# Patient Record
Sex: Female | Born: 1950
Health system: Southern US, Community
[De-identification: ages and names within clinical notes are randomized; demographics above are authoritative.]

---

## 2000-08-26 ENCOUNTER — Other Ambulatory Visit: Admission: RE | Admit: 2000-08-26 | Discharge: 2000-08-26 | Payer: Self-pay | Admitting: Dermatology

## 2003-04-01 ENCOUNTER — Ambulatory Visit (HOSPITAL_COMMUNITY): Admission: RE | Admit: 2003-04-01 | Discharge: 2003-04-01 | Payer: Self-pay | Admitting: Internal Medicine

## 2009-01-12 ENCOUNTER — Encounter: Admission: RE | Admit: 2009-01-12 | Discharge: 2009-01-12 | Payer: Self-pay

## 2010-03-16 ENCOUNTER — Other Ambulatory Visit: Payer: Self-pay | Admitting: Interventional Radiology

## 2010-03-16 DIAGNOSIS — I8393 Asymptomatic varicose veins of bilateral lower extremities: Secondary | ICD-10-CM

## 2010-03-22 ENCOUNTER — Ambulatory Visit
Admission: RE | Admit: 2010-03-22 | Discharge: 2010-03-22 | Disposition: A | Discharge status: Home / Self Care | Payer: BC Managed Care – PPO | Source: Ambulatory Visit | Attending: Interventional Radiology | Admitting: Interventional Radiology

## 2010-03-22 DIAGNOSIS — I8393 Asymptomatic varicose veins of bilateral lower extremities: Secondary | ICD-10-CM

## 2010-03-23 ENCOUNTER — Other Ambulatory Visit: Payer: Self-pay

## 2010-04-25 ENCOUNTER — Other Ambulatory Visit: Payer: Self-pay | Admitting: Interventional Radiology

## 2010-04-25 DIAGNOSIS — Z09 Encounter for follow-up examination after completed treatment for conditions other than malignant neoplasm: Secondary | ICD-10-CM

## 2010-04-25 DIAGNOSIS — I83811 Varicose veins of right lower extremities with pain: Secondary | ICD-10-CM

## 2010-05-17 ENCOUNTER — Ambulatory Visit
Admission: RE | Admit: 2010-05-17 | Discharge: 2010-05-17 | Disposition: A | Discharge status: Home / Self Care | Payer: BC Managed Care – PPO | Source: Ambulatory Visit | Attending: Interventional Radiology | Admitting: Interventional Radiology

## 2010-05-17 VITALS — BP 104/70 | HR 74 | Temp 97.8°F | Resp 14 | Ht 62.0 in | Wt 125.0 lb

## 2010-05-17 DIAGNOSIS — I83811 Varicose veins of right lower extremities with pain: Secondary | ICD-10-CM

## 2010-05-17 MED ORDER — DIAZEPAM 5 MG PO TABS: 5.0000 mg | ORAL_TABLET | ORAL | Status: DC

## 2010-05-17 MED ORDER — SODIUM CHLORIDE 0.9 % IJ SOLN: 3.0000 mL | INTRAMUSCULAR | Status: DC | PRN

## 2010-05-17 NOTE — Progress Notes (Signed)
Informed consent obtained.  Given verbal & written D/C instructions.  Pt states that she understands.    8657  Valium 5 mg po    per Dr Fredia Sorrow            22 g x1" angio started IV Right antecubital (x 1 attempt) w/ NSL & 7" ext.  Flushed w/ 5 ml NSS w/o difficulty.  Site "u".    Per Dr Fredia Sorrow  1120    Pt wearing 20-30 mm Hg thigh high graduated compression garment on RLE post procedure.    1125    IV D'C'd.  Catheter intact. Site "u".       1135   Ambulated x 10 mins.  Gait steady.  Tolerated well.  1145   Reviewed D/C instructions w/ pt.  Husband available to drive.  Pt D/C'd to home.

## 2010-05-18 ENCOUNTER — Ambulatory Visit: Payer: BC Managed Care – PPO

## 2010-05-18 ENCOUNTER — Other Ambulatory Visit: Payer: BC Managed Care – PPO

## 2010-05-24 ENCOUNTER — Ambulatory Visit
Admission: RE | Admit: 2010-05-24 | Discharge: 2010-05-24 | Disposition: A | Discharge status: Home / Self Care | Payer: BC Managed Care – PPO | Source: Ambulatory Visit | Attending: Interventional Radiology | Admitting: Interventional Radiology

## 2010-05-24 ENCOUNTER — Other Ambulatory Visit: Payer: Self-pay | Admitting: Interventional Radiology

## 2010-05-24 DIAGNOSIS — I83811 Varicose veins of right lower extremities with pain: Secondary | ICD-10-CM

## 2010-05-24 DIAGNOSIS — Z09 Encounter for follow-up examination after completed treatment for conditions other than malignant neoplasm: Secondary | ICD-10-CM

## 2010-06-20 ENCOUNTER — Other Ambulatory Visit: Payer: BC Managed Care – PPO

## 2010-06-20 ENCOUNTER — Ambulatory Visit: Payer: BC Managed Care – PPO

## 2010-06-30 NOTE — Consult Note (Signed)
NAME:  Tracy Adams, Tracy Adams                         ACCOUNT NO.:  [id number]   MEDICAL RECORD NO.:  [id number]                  PATIENT TYPE:   LOCATION:                                       FACILITY:   PHYSICIAN:  Lionel December, M.D.                 DATE OF BIRTH:  11-13-1950   DATE OF CONSULTATION:  DATE OF DISCHARGE:                                   CONSULTATION   PRESENTING COMPLAINT:  Epigastric pain and nausea of over two months  duration.   HISTORY OF PRESENT ILLNESS:  Tracy Adams is a 60 year old Caucasian female who is  referred per courtesy of Dr. Eliberto Ivory for GI evaluation. She was in her usual  state of health until November of last year when she began to experience  nausea and epigastric pain. She felt that these symptoms were due to a lot  of her medication and she decided to stop her Bextra, but could not tell any  difference.  She was subsequently seen by Dr. Eliberto Ivory.  Her Actonel was also  discontinued. She was initially treated with Prilosec which did not help and  similarly she failed dicyclomine and for the last week she has been on  Protonix and so far has not seen a big improvement. She feels as if she is  going throw up, but she has not had emesis. She describes this as being in  the mid epigastric area which she describes as a dull pain. It is more  pronounced when she is fasting or when she eats she full. She feels like she  is going throw up.  She also feels bloated.  She had a normal CBC and LFTs  on March 05, 2003. Her H. pylori serology was also negative as was upper  abdominal ultrasound. The patient denies recent change in her bowel habits,  melena, or rectal bleeding. She feels her appetite is fair and she has not  lost any weight recently.   REVIEW OF SYSTEMS:  Negative for heartburn or dysphagia.   She is presently on Protonix 40 mg q.a.m., B12 1 mg daily, Ester-C 500 mg  daily, ________ daily, Caltrate and vitamin D, Flax oil 1 gm q.i.d., B6 50  mg  daily, and valproic acid daily.   PAST MEDICAL HISTORY:  She has osteoarthrosis involving her knees, joints,  and both hands. She also has either osteoporosis or osteopenia. She was on  Actonel for about a year.  She also has a history of acne for which she uses  amoxicillin on a p.r.n. basis. She has had bilateral bunionectomy about five  years ago.   ALLERGIES:  NK.   FAMILY HISTORY:  Both parents had heart disease. Mother died of MI in her  55s and father of Alzheimer disease also in his 67s.  She has seven sisters,  some of whom have high cholesterol, two have osteoarthrosis, one has RA, and  two sisters  have hypothyroidism. She lost one brother in an auto accident in  his 4s and she has three brothers living. One has hypercholesterolemia.   SOCIAL HISTORY:  She is married. She has three children in good health. She  worked at [employer] for 10 years, but now she works intermittently  and paints houses. She has never smoked cigarettes and does not drink  alcohol.   PHYSICAL EXAMINATION:  GENERAL: A pleasant, well-nourished, well-developed  Caucasian female who is in no acute distress.  VITAL SIGNS: She weighs 133 pounds. She is 5 feet 2 inches tall, pulse 74  per minute, blood pressure 120/80, temperature 96.6.  HEENT: Conjunctiva is pink. Sclera is nonicteric. Oropharyngeal mucosa is  normal.  NECK: Without masses or thyromegaly.  CARDIAC: Regular rhythm. Normal S1 and S2. No murmur or gallop noted.  LUNGS: Clear to auscultation.  ABDOMEN: Symmetric. Bowel sounds are normal. No bruits noted. Palpation  reveals soft abdomen with mid epigastric tenderness which is moderate with  some guarding. No organomegaly or masses.  RECTAL EXAM: Guaiac negative stool. She does not have clubbing or peripheral  edema.   Laboratory from March 05, 2003, reveal bilirubin 0.6, AP 47, AST 21,  calcium 10.2, albumin 4.7.  WBC 4.6, H&H 13.6 and 39.8, platelet count not  available. Her  TSH was 2.9. H. pylori serology is negative.   ASSESSMENT:  Tracy Adams is a 60 year old Caucasian female with an almost three-  month history of epigastric pain whose symptoms are not improved with PPI  antispasmodic. She was also taken off her Actonel and NSAID without any  symptomatic improvement. Her LFTs are normal, Helicobacter pylori serology  is negative, and ultrasound is also negative for cholelithiasis. She could  have chronic dyspepsia, but we need to make sure she does not have  peptic  ulcer disease.   RECOMMENDATIONS:  Would increase her Protonix to 40 mg b.i.d. Diagnostic  esophagogastroduodenoscopy to be performed at [facility] in the near future. If EGD  is entirely normal, will proceed with abdominal CT with attention to  pancreas.   I would like to thank Dr. Eliberto Ivory for the opportunity to participate in the  care of this nice lady.   ADDENDUM:  We also discussed the issue of screening colonoscopy, but I  suggested that this be deferred until her acute symptomatology has been  addressed and resolved.      ___________________________________________                                            Lionel December, M.D.   NR/MEDQ  D:  03/29/2003  T:  03/29/2003  Job:  [id number]   cc:   Eliberto Ivory, M.D.

## 2010-06-30 NOTE — Op Note (Signed)
NAMECHELLE, Tracy Adams                         ACCOUNT NO.:  [id number]   MEDICAL RECORD NO.:  [id number]                   PATIENT TYPE:  AMB   LOCATION:  DAY                                  FACILITY:  [facility]   PHYSICIAN:  Lionel December, M.D.                 DATE OF BIRTH:  1951/01/23   DATE OF PROCEDURE:  04/01/2003  DATE OF DISCHARGE:                                 OPERATIVE REPORT   PROCEDURE:  Esophagogastroduodenoscopy.   INDICATIONS FOR PROCEDURE:  [name] is a 60 year old Caucasian female with  over three month history of nausea and epigastric pain who has not responded  to acid suppression. She is also on NSAID and Fosamax which were  discontinued but without symptomatic improvement. Ultrasound had been  negative and H. pylori serology is also negative. She is undergoing  diagnostic EGD.  The procedure is reviewed with the patient and informed  consent was obtained.   PREOP MEDICATIONS:  Cetacaine spray for pharyngeal topical anesthesia,  Demerol 25 mg IV, Versed 8 mg IV in divided dose.   FINDINGS:  Procedure performed in endoscopy suite. The patient's vital signs  and O2 sat were monitored during the procedure and remained stable. The  patient was placed in the left lateral decubitus position and Olympus  videoscope was passed through oropharynx without any difficulty into the  esophagus.   ESOPHAGUS:  The mucosa of the esophagus was normal throughout.  The  squamocolumnar junction was at 38 cm.  There was a 3 cm sliding hiatal  hernia.   STOMACH:  It was empty and distended very well with insufflation. Folds of  the proximal stomach were normal. Examination of the mucosa, gastric body,  antrum, pyloric channel as well as angularis, fundus and cardia was normal.   DUODENUM:  Examination of the bulb and second part of the duodenum was  normal. The endoscope was withdrawn.   The patient tolerated the procedure well.   FINAL DIAGNOSIS:  Small sliding hiatal hernia  otherwise normal exam.   RECOMMENDATIONS:  1. Will drop her Protonix back to 40 mg q.a.m.  2. Dicyclomine 10-20 mg before breakfast and evening meal.  3. Will proceed with abdominal CT to be performed at __________.      ___________________________________________                                            Lionel December, M.D.   NR/MEDQ  D:  04/01/2003  T:  04/01/2003  Job:  [id number]   cc:   Weyman Pedro, M.D., [place], [place]

## 2010-07-05 ENCOUNTER — Ambulatory Visit
Admission: RE | Admit: 2010-07-05 | Discharge: 2010-07-05 | Disposition: A | Discharge status: Home / Self Care | Payer: BC Managed Care – PPO | Source: Ambulatory Visit | Attending: Interventional Radiology | Admitting: Interventional Radiology

## 2010-07-05 DIAGNOSIS — I83811 Varicose veins of right lower extremities with pain: Secondary | ICD-10-CM

## 2010-12-21 ENCOUNTER — Encounter: Payer: Self-pay | Admitting: Radiology

## 2011-01-09 ENCOUNTER — Other Ambulatory Visit: Payer: Self-pay | Admitting: Gastroenterology

## 2011-01-09 DIAGNOSIS — R1013 Epigastric pain: Secondary | ICD-10-CM

## 2011-01-09 DIAGNOSIS — R110 Nausea: Secondary | ICD-10-CM

## 2011-01-25 ENCOUNTER — Ambulatory Visit (HOSPITAL_COMMUNITY)
Admission: RE | Admit: 2011-01-25 | Discharge: 2011-01-25 | Disposition: A | Discharge status: Home / Self Care | Payer: BC Managed Care – PPO | Source: Ambulatory Visit | Attending: Gastroenterology | Admitting: Gastroenterology

## 2011-01-25 DIAGNOSIS — R1013 Epigastric pain: Secondary | ICD-10-CM | POA: Insufficient documentation

## 2011-01-25 DIAGNOSIS — R110 Nausea: Secondary | ICD-10-CM

## 2011-01-25 MED ORDER — SINCALIDE 5 MCG IJ SOLR
INTRAMUSCULAR | Status: AC
  Filled 2011-01-25: qty 5

## 2011-01-25 MED ORDER — TECHNETIUM TC 99M MEBROFENIN IV KIT
5.0000 | PACK | Freq: Once | INTRAVENOUS | Status: AC | PRN
  Administered 2011-01-25: 5 via INTRAVENOUS

## 2011-01-25 MED ORDER — SINCALIDE 5 MCG IJ SOLR
0.0200 ug/kg | Freq: Once | INTRAMUSCULAR | Status: AC
  Administered 2011-01-25: 1.1 ug via INTRAVENOUS

## 2011-02-01 ENCOUNTER — Other Ambulatory Visit: Payer: Self-pay | Admitting: Gastroenterology

## 2011-02-01 DIAGNOSIS — R1013 Epigastric pain: Secondary | ICD-10-CM

## 2011-02-01 DIAGNOSIS — R110 Nausea: Secondary | ICD-10-CM

## 2011-02-09 ENCOUNTER — Encounter (HOSPITAL_COMMUNITY)
Admission: RE | Admit: 2011-02-09 | Discharge: 2011-02-09 | Disposition: A | Discharge status: Home / Self Care | Payer: BC Managed Care – PPO | Source: Ambulatory Visit | Attending: Gastroenterology | Admitting: Gastroenterology

## 2011-02-09 DIAGNOSIS — R1013 Epigastric pain: Secondary | ICD-10-CM | POA: Insufficient documentation

## 2011-02-09 DIAGNOSIS — R110 Nausea: Secondary | ICD-10-CM | POA: Insufficient documentation

## 2011-02-09 MED ORDER — TECHNETIUM TC 99M SULFUR COLLOID
2.0000 | Freq: Once | INTRAVENOUS | Status: AC | PRN
  Administered 2011-02-09: 2 via ORAL

## 2011-03-22 ENCOUNTER — Ambulatory Visit
Admission: RE | Admit: 2011-03-22 | Discharge: 2011-03-22 | Disposition: A | Discharge status: Home / Self Care | Payer: BC Managed Care – PPO | Source: Ambulatory Visit | Attending: Plastic and Reconstructive Surgery | Admitting: Plastic and Reconstructive Surgery

## 2011-03-22 ENCOUNTER — Other Ambulatory Visit: Payer: Self-pay | Admitting: Unknown Physician Specialty

## 2011-03-22 ENCOUNTER — Other Ambulatory Visit: Payer: Self-pay | Admitting: Plastic and Reconstructive Surgery

## 2011-03-22 DIAGNOSIS — T8543XA Leakage of breast prosthesis and implant, initial encounter: Secondary | ICD-10-CM

## 2015-03-31 ENCOUNTER — Ambulatory Visit (INDEPENDENT_AMBULATORY_CARE_PROVIDER_SITE_OTHER): Payer: BLUE CROSS/BLUE SHIELD | Admitting: Otolaryngology

## 2015-03-31 DIAGNOSIS — J342 Deviated nasal septum: Secondary | ICD-10-CM

## 2015-03-31 DIAGNOSIS — J310 Chronic rhinitis: Secondary | ICD-10-CM

## 2015-04-28 ENCOUNTER — Ambulatory Visit (INDEPENDENT_AMBULATORY_CARE_PROVIDER_SITE_OTHER): Payer: BLUE CROSS/BLUE SHIELD | Admitting: Otolaryngology

## 2015-04-28 DIAGNOSIS — J310 Chronic rhinitis: Secondary | ICD-10-CM | POA: Diagnosis not present

## 2016-10-11 DIAGNOSIS — Z6823 Body mass index (BMI) 23.0-23.9, adult: Secondary | ICD-10-CM | POA: Diagnosis not present

## 2016-10-11 DIAGNOSIS — Z79899 Other long term (current) drug therapy: Secondary | ICD-10-CM | POA: Diagnosis not present

## 2016-10-11 DIAGNOSIS — E559 Vitamin D deficiency, unspecified: Secondary | ICD-10-CM | POA: Diagnosis not present

## 2016-10-11 DIAGNOSIS — Z7189 Other specified counseling: Secondary | ICD-10-CM | POA: Diagnosis not present

## 2016-10-11 DIAGNOSIS — F419 Anxiety disorder, unspecified: Secondary | ICD-10-CM | POA: Diagnosis not present

## 2016-10-11 DIAGNOSIS — E8940 Asymptomatic postprocedural ovarian failure: Secondary | ICD-10-CM | POA: Diagnosis not present

## 2016-10-11 DIAGNOSIS — Z299 Encounter for prophylactic measures, unspecified: Secondary | ICD-10-CM | POA: Diagnosis not present

## 2016-10-11 DIAGNOSIS — E7800 Pure hypercholesterolemia, unspecified: Secondary | ICD-10-CM | POA: Diagnosis not present

## 2016-10-11 DIAGNOSIS — Z1231 Encounter for screening mammogram for malignant neoplasm of breast: Secondary | ICD-10-CM | POA: Diagnosis not present

## 2016-10-11 DIAGNOSIS — Z0000 Encounter for general adult medical examination without abnormal findings: Secondary | ICD-10-CM | POA: Diagnosis not present

## 2016-10-11 DIAGNOSIS — Z1389 Encounter for screening for other disorder: Secondary | ICD-10-CM | POA: Diagnosis not present

## 2016-10-11 DIAGNOSIS — Z1211 Encounter for screening for malignant neoplasm of colon: Secondary | ICD-10-CM | POA: Diagnosis not present

## 2016-12-06 DIAGNOSIS — Z299 Encounter for prophylactic measures, unspecified: Secondary | ICD-10-CM | POA: Diagnosis not present

## 2016-12-06 DIAGNOSIS — M19041 Primary osteoarthritis, right hand: Secondary | ICD-10-CM | POA: Diagnosis not present

## 2016-12-06 DIAGNOSIS — Z2821 Immunization not carried out because of patient refusal: Secondary | ICD-10-CM | POA: Diagnosis not present

## 2016-12-06 DIAGNOSIS — E2839 Other primary ovarian failure: Secondary | ICD-10-CM | POA: Diagnosis not present

## 2016-12-06 DIAGNOSIS — M79641 Pain in right hand: Secondary | ICD-10-CM | POA: Diagnosis not present

## 2016-12-06 DIAGNOSIS — Z6823 Body mass index (BMI) 23.0-23.9, adult: Secondary | ICD-10-CM | POA: Diagnosis not present

## 2016-12-06 DIAGNOSIS — B001 Herpesviral vesicular dermatitis: Secondary | ICD-10-CM | POA: Diagnosis not present

## 2017-01-17 DIAGNOSIS — L821 Other seborrheic keratosis: Secondary | ICD-10-CM | POA: Diagnosis not present

## 2017-01-17 DIAGNOSIS — D485 Neoplasm of uncertain behavior of skin: Secondary | ICD-10-CM | POA: Diagnosis not present

## 2017-01-19 DIAGNOSIS — H10013 Acute follicular conjunctivitis, bilateral: Secondary | ICD-10-CM | POA: Diagnosis not present

## 2017-02-10 DIAGNOSIS — R509 Fever, unspecified: Secondary | ICD-10-CM | POA: Diagnosis not present

## 2017-02-10 DIAGNOSIS — J101 Influenza due to other identified influenza virus with other respiratory manifestations: Secondary | ICD-10-CM | POA: Diagnosis not present

## 2017-02-10 DIAGNOSIS — R062 Wheezing: Secondary | ICD-10-CM | POA: Diagnosis not present

## 2017-02-10 DIAGNOSIS — J40 Bronchitis, not specified as acute or chronic: Secondary | ICD-10-CM | POA: Diagnosis not present

## 2017-02-11 DIAGNOSIS — J111 Influenza due to unidentified influenza virus with other respiratory manifestations: Secondary | ICD-10-CM | POA: Diagnosis not present

## 2017-02-11 DIAGNOSIS — Z713 Dietary counseling and surveillance: Secondary | ICD-10-CM | POA: Diagnosis not present

## 2017-02-11 DIAGNOSIS — Z6822 Body mass index (BMI) 22.0-22.9, adult: Secondary | ICD-10-CM | POA: Diagnosis not present

## 2017-02-11 DIAGNOSIS — Z299 Encounter for prophylactic measures, unspecified: Secondary | ICD-10-CM | POA: Diagnosis not present

## 2017-02-21 DIAGNOSIS — H44009 Unspecified purulent endophthalmitis, unspecified eye: Secondary | ICD-10-CM | POA: Diagnosis not present

## 2017-02-21 DIAGNOSIS — Z789 Other specified health status: Secondary | ICD-10-CM | POA: Diagnosis not present

## 2017-02-21 DIAGNOSIS — Z299 Encounter for prophylactic measures, unspecified: Secondary | ICD-10-CM | POA: Diagnosis not present

## 2017-02-21 DIAGNOSIS — J069 Acute upper respiratory infection, unspecified: Secondary | ICD-10-CM | POA: Diagnosis not present

## 2017-02-21 DIAGNOSIS — Z6822 Body mass index (BMI) 22.0-22.9, adult: Secondary | ICD-10-CM | POA: Diagnosis not present

## 2017-04-18 ENCOUNTER — Encounter: Payer: Self-pay | Admitting: *Deleted

## 2017-04-19 ENCOUNTER — Ambulatory Visit (INDEPENDENT_AMBULATORY_CARE_PROVIDER_SITE_OTHER): Payer: Medicare Other | Admitting: Cardiovascular Disease

## 2017-04-19 ENCOUNTER — Encounter: Payer: Self-pay | Admitting: *Deleted

## 2017-04-19 ENCOUNTER — Encounter: Payer: Self-pay | Admitting: Cardiovascular Disease

## 2017-04-19 VITALS — BP 104/64 | HR 87 | Ht 62.0 in | Wt 133.0 lb

## 2017-04-19 DIAGNOSIS — R002 Palpitations: Secondary | ICD-10-CM | POA: Diagnosis not present

## 2017-04-19 DIAGNOSIS — R413 Other amnesia: Secondary | ICD-10-CM

## 2017-04-19 DIAGNOSIS — R079 Chest pain, unspecified: Secondary | ICD-10-CM

## 2017-04-19 DIAGNOSIS — R252 Cramp and spasm: Secondary | ICD-10-CM | POA: Diagnosis not present

## 2017-04-19 NOTE — Patient Instructions (Addendum)
Medication Instructions:  Continue all current medications.  Labwork: none  Testing/Procedures:  Your physician has requested that you have an echocardiogram. Echocardiography is a painless test that uses sound waves to create images of your heart. It provides your doctor with information about the size and shape of your heart and how well your heart's chambers and valves are working. This procedure takes approximately one hour. There are no restrictions for this procedure.  Your physician has requested that you have a stress echocardiogram. For further information please visit [email or web address]. Please follow instruction sheet as given.  Your physician has recommended that you wear a 21 day event monitor. Event monitors are medical devices that record the heart's electrical activity. Doctors most often [place] these monitors to diagnose arrhythmias. Arrhythmias are problems with the speed or rhythm of the heartbeat. The monitor is a small, portable device. You can wear one while you do your normal daily activities. This is usually used to diagnose what is causing palpitations/syncope (passing out).  Follow-Up: 6-8 weeks   Any Other Special Instructions Will Be Listed Below (If Applicable).  If you need a refill on your cardiac medications before your next appointment, please call your pharmacy.

## 2017-04-19 NOTE — Progress Notes (Signed)
CARDIOLOGY CONSULT NOTE  Patient ID: Tracy Adams MRN: [id number] DOB/AGE: [birth year] 67 y.o.  Admit date: (Not on file) Primary Physician: Glenda Chroman, MD Referring Physician: Glenda Chroman, MD  Reason for Consultation: Chest pain  HPI: Tracy Adams is a 67 y.o. female who is being seen today for the evaluation of chest pain at the request of Vyas, Dhruv B, MD.   I reviewed an ECG performed on 10/11/16 which demonstrated sinus rhythm with no ischemic ST segment or T wave ab normalities, nor any arrhythmias.  ECG performed in the office today which I ordered and personally interpreted demonstrates normal sinus rhythm with no ischemic ST segment or T-wave abnormalities, nor any arrhythmias.  She has been experiencing rapid palpitations followed by chest pain for the past 2 years.  Symptoms will last a few minutes and can occur both with rest and with exertion.  It is accompanied by shortness of breath, lightheadedness, and dizziness.  She denies syncope.  She also denies leg swelling, orthopnea, and paroxysmal nocturnal dyspnea.  Episodes occur once every 1 or 2 weeks.  She has also been experiencing bilateral feet cramping recently.  This is a new symptom for her.  She also describes intermittent right groin pain with difficulty lifting her right leg.  She also describes memory loss.  She said she was unable to recollect the year when filling out forms in our office today.  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  She does not smoke.  Family history: Father had Alzheimer's disease and died from this.  Mother died of an MI in her 45s.  Brother had CABG in his 19s.  A sister has coronary disease and had a stent in her 44s.    Allergies  Allergen Reactions  . Estrogens     NAUSEA  . Other     HYDROXYZINE sob DIFFICULTY BREATHING     Current Outpatient Medications  Medication Sig Dispense Refill  . ipratropium (ATROVENT) 0.06 %  nasal spray Place 2 sprays into both nostrils 2 (two) times daily.     No current facility-administered medications for this visit.     History reviewed. No pertinent past medical history.  History reviewed. No pertinent surgical history.  Social History   Socioeconomic History  . Marital status: Married    Spouse name: Not on file  . Number of children: Not on file  . Years of education: Not on file  . Highest education level: Not on file  Social Needs  . Financial resource strain: Not on file  . Food insecurity - worry: Not on file  . Food insecurity - inability: Not on file  . Transportation needs - medical: Not on file  . Transportation needs - non-medical: Not on file  Occupational History  . Not on file  Tobacco Use  . Smoking status: Never Smoker  . Smokeless tobacco: Never Used  Substance and Sexual Activity  . Alcohol use: No  . Drug use: No  . Sexual activity: Not on file  Other Topics Concern  . Not on file  Social History Narrative  . Not on file     No family history of premature CAD in 1st degree relatives.  Current Meds  Medication Sig  . ipratropium (ATROVENT) 0.06 % nasal spray Place 2 sprays into both nostrils 2 (two) times daily.      Review of systems complete and found  to be negative unless listed above in HPI    Physical exam Blood pressure 104/64, pulse 87, height 5\' 2"  (1.575 m), weight 133 lb (60.3 kg), SpO2 98 %. General: NAD Neck: No JVD, no thyromegaly or thyroid nodule.  Lungs: Clear to auscultation bilaterally with normal respiratory effort. CV: Nondisplaced PMI. Regular rate and rhythm, normal S1/S2, no S3/S4, no murmur.  No peripheral edema.  No carotid bruit.    Abdomen: Soft, nontender, no distention.  Skin: Intact without lesions or rashes.  Neurologic: Alert and oriented x 3.  Psych: Normal affect. Extremities: No clubbing or cyanosis.  HEENT: Normal.   ECG: Most recent ECG reviewed.   Labs: No results found for:  K, BUN, CREATININE, ALT, TSH, HGB   Lipids: No results found for: LDLCALC, LDLDIRECT, CHOL, TRIG, HDL      ASSESSMENT AND PLAN:  1.  Rapid palpitations with chest pain and shortness of breath: She may be experiencing paroxysmal tachycardia which could be representing a supraventricular arrhythmia such as atrial fibrillation or SVT.  I will obtain a 3-week event monitor for further characterization.  I will also obtain an echocardiogram to evaluate cardiac structure and function.  I will obtain a stress echocardiogram to assess for exercise-induced arrhythmias as well as to evaluate for inducible ischemic wall motion abnormalities.  2.  Bilateral feet cramps: Pedal pulses appear to be normal.  I educated her on exercise and stretching of the plantar fascia to help alleviate symptoms.  3.  Memory loss: She told me she did not tell her PCP about this.  At the very least she would need a Mini-Mental status examination questionnaire to then determine further investigations.  I will obtain a copy of blood work from her PCP.     Disposition: Follow up in 6-8 weeks.   Signed: Kate Sable, M.D., F.A.C.C.  04/19/2017, 1:50 PM

## 2017-04-23 ENCOUNTER — Encounter: Payer: Self-pay | Admitting: *Deleted

## 2017-04-23 ENCOUNTER — Telehealth: Payer: Self-pay | Admitting: Cardiovascular Disease

## 2017-04-23 NOTE — Telephone Encounter (Signed)
Pre-cert Verification for the following procedure   Stress Echo scheduled for 05-02-17   [facility]stine Na Echo scheduled for 05-02-17  [facility]

## 2017-04-29 ENCOUNTER — Ambulatory Visit: Payer: Medicare Other | Admitting: Orthopedic Surgery

## 2017-04-29 ENCOUNTER — Ambulatory Visit (INDEPENDENT_AMBULATORY_CARE_PROVIDER_SITE_OTHER): Payer: Medicare Other

## 2017-04-29 DIAGNOSIS — R002 Palpitations: Secondary | ICD-10-CM

## 2017-04-29 DIAGNOSIS — M654 Radial styloid tenosynovitis [de Quervain]: Secondary | ICD-10-CM | POA: Diagnosis not present

## 2017-04-29 DIAGNOSIS — M25531 Pain in right wrist: Secondary | ICD-10-CM | POA: Diagnosis not present

## 2017-05-01 ENCOUNTER — Ambulatory Visit: Payer: Medicare Other | Admitting: Orthopedic Surgery

## 2017-05-02 ENCOUNTER — Ambulatory Visit (HOSPITAL_COMMUNITY)
Admission: RE | Admit: 2017-05-02 | Discharge: 2017-05-02 | Disposition: A | Discharge status: Home / Self Care | Payer: Medicare Other | Source: Ambulatory Visit | Attending: Cardiovascular Disease | Admitting: Cardiovascular Disease

## 2017-05-02 ENCOUNTER — Other Ambulatory Visit: Payer: Self-pay

## 2017-05-02 ENCOUNTER — Ambulatory Visit (INDEPENDENT_AMBULATORY_CARE_PROVIDER_SITE_OTHER): Payer: Medicare Other

## 2017-05-02 DIAGNOSIS — R079 Chest pain, unspecified: Secondary | ICD-10-CM

## 2017-05-02 NOTE — Progress Notes (Signed)
*  PRELIMINARY RESULTS* Echocardiogram 2D Echocardiogram has been performed.  Tracy Adams 05/02/2017, 12:57 PM

## 2017-05-03 LAB — ECHOCARDIOGRAM STRESS TEST
CHL RATE OF PERCEIVED EXERTION: 13
CSEPEDS: 12 s
CSEPEW: 10.1 METS
CSEPHR: 101 %
CSEPPHR: 157 {beats}/min
Exercise duration (min): 8 min
MPHR: 155 {beats}/min
Rest HR: 81 {beats}/min

## 2017-05-09 ENCOUNTER — Telehealth: Payer: Self-pay | Admitting: *Deleted

## 2017-05-09 ENCOUNTER — Telehealth: Payer: Self-pay | Admitting: Cardiovascular Disease

## 2017-05-09 NOTE — Telephone Encounter (Signed)
STRESS ECHO -   Notes recorded by Herminio Commons, MD on 05/06/2017 at 3:58 PM EDT Normal.

## 2017-05-09 NOTE — Telephone Encounter (Signed)
Notes recorded by Laurine Blazer, LPN on 4/46/9507 at 2:25 PM EDT Patient notified. Copy to pmd.  ------  Notes recorded by Laurine Blazer, LPN on 7/50/5183 at 3:58 PM EDT Left message to return call.  ------  Notes recorded by Herminio Commons, MD on 05/06/2017 at 9:23 AM EDT Normal pumping function. Mild valve leakage.

## 2017-05-09 NOTE — Telephone Encounter (Signed)
Returning someones call 

## 2017-05-09 NOTE — Telephone Encounter (Signed)
Already notified patient of test results.

## 2017-05-09 NOTE — Telephone Encounter (Signed)
Patient notified.  Copy to pmd.   

## 2017-05-15 DIAGNOSIS — Z713 Dietary counseling and surveillance: Secondary | ICD-10-CM | POA: Diagnosis not present

## 2017-05-15 DIAGNOSIS — Z789 Other specified health status: Secondary | ICD-10-CM | POA: Diagnosis not present

## 2017-05-15 DIAGNOSIS — Z6822 Body mass index (BMI) 22.0-22.9, adult: Secondary | ICD-10-CM | POA: Diagnosis not present

## 2017-05-15 DIAGNOSIS — J029 Acute pharyngitis, unspecified: Secondary | ICD-10-CM | POA: Diagnosis not present

## 2017-05-15 DIAGNOSIS — Z299 Encounter for prophylactic measures, unspecified: Secondary | ICD-10-CM | POA: Diagnosis not present

## 2017-06-07 ENCOUNTER — Telehealth: Payer: Self-pay | Admitting: *Deleted

## 2017-06-07 NOTE — Telephone Encounter (Signed)
-----   Message from Herminio Commons, MD sent at 06/05/2017  8:55 AM EDT ----- Average heart rate 87 bpm.  Sinus rhythm and sinus tachycardia seen with PACs and PVCs.  There was one 5 beat and one 7 beat atrial run.  There were no sustained abnormal heart rhythms.  Symptoms correlated with both regular rhythms, fast heart rates, and extra beats. Start metoprolol 12.5 mg bid.

## 2017-06-07 NOTE — Telephone Encounter (Signed)
Pt aware and says she would rather not take metoprolol at this time. Asked that we print out results - pt husband (DPR) will come by office to pick up - routed to pcp

## 2017-06-15 DIAGNOSIS — H10013 Acute follicular conjunctivitis, bilateral: Secondary | ICD-10-CM | POA: Diagnosis not present

## 2017-06-25 ENCOUNTER — Ambulatory Visit: Payer: Medicare Other | Admitting: Cardiovascular Disease

## 2017-08-16 DIAGNOSIS — M654 Radial styloid tenosynovitis [de Quervain]: Secondary | ICD-10-CM | POA: Diagnosis not present

## 2017-08-22 DIAGNOSIS — M25531 Pain in right wrist: Secondary | ICD-10-CM | POA: Diagnosis not present

## 2017-08-22 DIAGNOSIS — S66811A Strain of other specified muscles, fascia and tendons at wrist and hand level, right hand, initial encounter: Secondary | ICD-10-CM | POA: Diagnosis not present

## 2017-08-22 DIAGNOSIS — M654 Radial styloid tenosynovitis [de Quervain]: Secondary | ICD-10-CM | POA: Diagnosis not present

## 2017-08-22 DIAGNOSIS — M19031 Primary osteoarthritis, right wrist: Secondary | ICD-10-CM | POA: Diagnosis not present

## 2017-08-28 DIAGNOSIS — M654 Radial styloid tenosynovitis [de Quervain]: Secondary | ICD-10-CM | POA: Diagnosis not present

## 2017-09-16 DIAGNOSIS — Z299 Encounter for prophylactic measures, unspecified: Secondary | ICD-10-CM | POA: Diagnosis not present

## 2017-09-16 DIAGNOSIS — M810 Age-related osteoporosis without current pathological fracture: Secondary | ICD-10-CM | POA: Diagnosis not present

## 2017-09-16 DIAGNOSIS — F419 Anxiety disorder, unspecified: Secondary | ICD-10-CM | POA: Diagnosis not present

## 2017-09-16 DIAGNOSIS — Z6822 Body mass index (BMI) 22.0-22.9, adult: Secondary | ICD-10-CM | POA: Diagnosis not present

## 2017-09-30 DIAGNOSIS — E7800 Pure hypercholesterolemia, unspecified: Secondary | ICD-10-CM | POA: Diagnosis not present

## 2017-09-30 DIAGNOSIS — Z299 Encounter for prophylactic measures, unspecified: Secondary | ICD-10-CM | POA: Diagnosis not present

## 2017-09-30 DIAGNOSIS — R202 Paresthesia of skin: Secondary | ICD-10-CM | POA: Diagnosis not present

## 2017-09-30 DIAGNOSIS — R5383 Other fatigue: Secondary | ICD-10-CM | POA: Diagnosis not present

## 2017-09-30 DIAGNOSIS — E559 Vitamin D deficiency, unspecified: Secondary | ICD-10-CM | POA: Diagnosis not present

## 2017-09-30 DIAGNOSIS — F419 Anxiety disorder, unspecified: Secondary | ICD-10-CM | POA: Diagnosis not present

## 2017-09-30 DIAGNOSIS — Z6823 Body mass index (BMI) 23.0-23.9, adult: Secondary | ICD-10-CM | POA: Diagnosis not present

## 2017-10-21 DIAGNOSIS — F419 Anxiety disorder, unspecified: Secondary | ICD-10-CM | POA: Diagnosis not present

## 2017-10-21 DIAGNOSIS — Z79899 Other long term (current) drug therapy: Secondary | ICD-10-CM | POA: Diagnosis not present

## 2017-10-21 DIAGNOSIS — Z6822 Body mass index (BMI) 22.0-22.9, adult: Secondary | ICD-10-CM | POA: Diagnosis not present

## 2017-10-21 DIAGNOSIS — Z299 Encounter for prophylactic measures, unspecified: Secondary | ICD-10-CM | POA: Diagnosis not present

## 2017-10-21 DIAGNOSIS — R5383 Other fatigue: Secondary | ICD-10-CM | POA: Diagnosis not present

## 2017-10-21 DIAGNOSIS — E7800 Pure hypercholesterolemia, unspecified: Secondary | ICD-10-CM | POA: Diagnosis not present

## 2017-10-21 DIAGNOSIS — Z0000 Encounter for general adult medical examination without abnormal findings: Secondary | ICD-10-CM | POA: Diagnosis not present

## 2017-10-21 DIAGNOSIS — Z7189 Other specified counseling: Secondary | ICD-10-CM | POA: Diagnosis not present

## 2017-10-21 DIAGNOSIS — Z1331 Encounter for screening for depression: Secondary | ICD-10-CM | POA: Diagnosis not present

## 2017-10-21 DIAGNOSIS — Z1339 Encounter for screening examination for other mental health and behavioral disorders: Secondary | ICD-10-CM | POA: Diagnosis not present

## 2018-01-31 DIAGNOSIS — M533 Sacrococcygeal disorders, not elsewhere classified: Secondary | ICD-10-CM | POA: Diagnosis not present

## 2018-01-31 DIAGNOSIS — Z6822 Body mass index (BMI) 22.0-22.9, adult: Secondary | ICD-10-CM | POA: Diagnosis not present

## 2018-01-31 DIAGNOSIS — S3992XA Unspecified injury of lower back, initial encounter: Secondary | ICD-10-CM | POA: Diagnosis not present

## 2018-01-31 DIAGNOSIS — Z2821 Immunization not carried out because of patient refusal: Secondary | ICD-10-CM | POA: Diagnosis not present

## 2018-01-31 DIAGNOSIS — Z299 Encounter for prophylactic measures, unspecified: Secondary | ICD-10-CM | POA: Diagnosis not present

## 2018-03-16 DIAGNOSIS — S63636A Sprain of interphalangeal joint of right little finger, initial encounter: Secondary | ICD-10-CM | POA: Diagnosis not present

## 2018-03-16 DIAGNOSIS — S322XXA Fracture of coccyx, initial encounter for closed fracture: Secondary | ICD-10-CM | POA: Diagnosis not present

## 2018-04-14 DIAGNOSIS — L989 Disorder of the skin and subcutaneous tissue, unspecified: Secondary | ICD-10-CM | POA: Diagnosis not present

## 2018-04-14 DIAGNOSIS — Z299 Encounter for prophylactic measures, unspecified: Secondary | ICD-10-CM | POA: Diagnosis not present

## 2018-04-14 DIAGNOSIS — M19049 Primary osteoarthritis, unspecified hand: Secondary | ICD-10-CM | POA: Diagnosis not present

## 2018-04-29 DIAGNOSIS — L570 Actinic keratosis: Secondary | ICD-10-CM | POA: Diagnosis not present

## 2018-04-29 DIAGNOSIS — L728 Other follicular cysts of the skin and subcutaneous tissue: Secondary | ICD-10-CM | POA: Diagnosis not present

## 2018-04-29 DIAGNOSIS — L280 Lichen simplex chronicus: Secondary | ICD-10-CM | POA: Diagnosis not present

## 2018-05-01 DIAGNOSIS — R208 Other disturbances of skin sensation: Secondary | ICD-10-CM | POA: Diagnosis not present

## 2018-05-01 DIAGNOSIS — L728 Other follicular cysts of the skin and subcutaneous tissue: Secondary | ICD-10-CM | POA: Diagnosis not present

## 2018-05-01 DIAGNOSIS — B0089 Other herpesviral infection: Secondary | ICD-10-CM | POA: Diagnosis not present

## 2018-05-01 DIAGNOSIS — L700 Acne vulgaris: Secondary | ICD-10-CM | POA: Diagnosis not present

## 2018-05-01 DIAGNOSIS — L578 Other skin changes due to chronic exposure to nonionizing radiation: Secondary | ICD-10-CM | POA: Diagnosis not present

## 2018-06-26 DIAGNOSIS — Z03818 Encounter for observation for suspected exposure to other biological agents ruled out: Secondary | ICD-10-CM | POA: Diagnosis not present

## 2018-10-09 ENCOUNTER — Other Ambulatory Visit: Payer: Self-pay

## 2018-10-09 DIAGNOSIS — R6889 Other general symptoms and signs: Secondary | ICD-10-CM | POA: Diagnosis not present

## 2018-10-09 DIAGNOSIS — Z20822 Contact with and (suspected) exposure to covid-19: Secondary | ICD-10-CM

## 2018-10-11 LAB — NOVEL CORONAVIRUS, NAA: SARS-CoV-2, NAA: NOT DETECTED

## 2019-03-19 DIAGNOSIS — E559 Vitamin D deficiency, unspecified: Secondary | ICD-10-CM | POA: Diagnosis not present

## 2019-03-19 DIAGNOSIS — Z299 Encounter for prophylactic measures, unspecified: Secondary | ICD-10-CM | POA: Diagnosis not present

## 2019-03-19 DIAGNOSIS — Z1339 Encounter for screening examination for other mental health and behavioral disorders: Secondary | ICD-10-CM | POA: Diagnosis not present

## 2019-03-19 DIAGNOSIS — Z7189 Other specified counseling: Secondary | ICD-10-CM | POA: Diagnosis not present

## 2019-03-19 DIAGNOSIS — F419 Anxiety disorder, unspecified: Secondary | ICD-10-CM | POA: Diagnosis not present

## 2019-03-19 DIAGNOSIS — E7800 Pure hypercholesterolemia, unspecified: Secondary | ICD-10-CM | POA: Diagnosis not present

## 2019-03-19 DIAGNOSIS — Z0000 Encounter for general adult medical examination without abnormal findings: Secondary | ICD-10-CM | POA: Diagnosis not present

## 2019-03-19 DIAGNOSIS — Z1331 Encounter for screening for depression: Secondary | ICD-10-CM | POA: Diagnosis not present

## 2019-03-19 DIAGNOSIS — R5383 Other fatigue: Secondary | ICD-10-CM | POA: Diagnosis not present

## 2019-03-19 DIAGNOSIS — Z79899 Other long term (current) drug therapy: Secondary | ICD-10-CM | POA: Diagnosis not present

## 2019-04-10 DIAGNOSIS — E2839 Other primary ovarian failure: Secondary | ICD-10-CM | POA: Diagnosis not present

## 2019-04-20 DIAGNOSIS — L570 Actinic keratosis: Secondary | ICD-10-CM | POA: Diagnosis not present

## 2019-05-12 DIAGNOSIS — Z789 Other specified health status: Secondary | ICD-10-CM | POA: Diagnosis not present

## 2019-05-12 DIAGNOSIS — J069 Acute upper respiratory infection, unspecified: Secondary | ICD-10-CM | POA: Diagnosis not present

## 2019-05-12 DIAGNOSIS — Z299 Encounter for prophylactic measures, unspecified: Secondary | ICD-10-CM | POA: Diagnosis not present

## 2019-05-25 DIAGNOSIS — L700 Acne vulgaris: Secondary | ICD-10-CM | POA: Diagnosis not present

## 2019-05-25 DIAGNOSIS — L820 Inflamed seborrheic keratosis: Secondary | ICD-10-CM | POA: Diagnosis not present

## 2019-05-25 DIAGNOSIS — R208 Other disturbances of skin sensation: Secondary | ICD-10-CM | POA: Diagnosis not present

## 2019-05-28 ENCOUNTER — Ambulatory Visit: Payer: Medicare Other | Attending: Internal Medicine

## 2019-05-28 ENCOUNTER — Other Ambulatory Visit: Payer: Self-pay

## 2019-05-28 DIAGNOSIS — Z20822 Contact with and (suspected) exposure to covid-19: Secondary | ICD-10-CM

## 2019-05-29 ENCOUNTER — Telehealth: Payer: Self-pay | Admitting: *Deleted

## 2019-05-29 LAB — NOVEL CORONAVIRUS, NAA: SARS-CoV-2, NAA: NOT DETECTED

## 2019-05-29 LAB — SARS-COV-2, NAA 2 DAY TAT

## 2019-05-29 NOTE — Telephone Encounter (Signed)
Pt called in requesting COVID-19 tet result.   I let her know it was not detected meaning she did not have the virus. She thanked me for my help.

## 2019-06-04 DIAGNOSIS — Z20828 Contact with and (suspected) exposure to other viral communicable diseases: Secondary | ICD-10-CM | POA: Diagnosis not present

## 2019-08-27 DIAGNOSIS — M549 Dorsalgia, unspecified: Secondary | ICD-10-CM | POA: Diagnosis not present

## 2019-08-27 DIAGNOSIS — M47816 Spondylosis without myelopathy or radiculopathy, lumbar region: Secondary | ICD-10-CM | POA: Diagnosis not present

## 2019-08-27 DIAGNOSIS — Z299 Encounter for prophylactic measures, unspecified: Secondary | ICD-10-CM | POA: Diagnosis not present

## 2019-08-27 DIAGNOSIS — I7090 Unspecified atherosclerosis: Secondary | ICD-10-CM | POA: Diagnosis not present

## 2019-08-27 DIAGNOSIS — E7800 Pure hypercholesterolemia, unspecified: Secondary | ICD-10-CM | POA: Diagnosis not present

## 2019-09-15 DIAGNOSIS — L570 Actinic keratosis: Secondary | ICD-10-CM | POA: Diagnosis not present

## 2020-03-14 DIAGNOSIS — L570 Actinic keratosis: Secondary | ICD-10-CM | POA: Diagnosis not present

## 2020-05-05 DIAGNOSIS — H10013 Acute follicular conjunctivitis, bilateral: Secondary | ICD-10-CM | POA: Diagnosis not present

## 2020-08-05 DIAGNOSIS — Z0000 Encounter for general adult medical examination without abnormal findings: Secondary | ICD-10-CM | POA: Diagnosis not present

## 2020-08-05 DIAGNOSIS — M810 Age-related osteoporosis without current pathological fracture: Secondary | ICD-10-CM | POA: Diagnosis not present

## 2020-08-05 DIAGNOSIS — Z1331 Encounter for screening for depression: Secondary | ICD-10-CM | POA: Diagnosis not present

## 2020-08-05 DIAGNOSIS — Z789 Other specified health status: Secondary | ICD-10-CM | POA: Diagnosis not present

## 2020-08-05 DIAGNOSIS — E7800 Pure hypercholesterolemia, unspecified: Secondary | ICD-10-CM | POA: Diagnosis not present

## 2020-08-05 DIAGNOSIS — Z1339 Encounter for screening examination for other mental health and behavioral disorders: Secondary | ICD-10-CM | POA: Diagnosis not present

## 2020-08-05 DIAGNOSIS — Z6821 Body mass index (BMI) 21.0-21.9, adult: Secondary | ICD-10-CM | POA: Diagnosis not present

## 2020-08-05 DIAGNOSIS — Z7189 Other specified counseling: Secondary | ICD-10-CM | POA: Diagnosis not present

## 2020-08-05 DIAGNOSIS — E538 Deficiency of other specified B group vitamins: Secondary | ICD-10-CM | POA: Diagnosis not present

## 2020-08-05 DIAGNOSIS — Z299 Encounter for prophylactic measures, unspecified: Secondary | ICD-10-CM | POA: Diagnosis not present

## 2020-08-05 DIAGNOSIS — F419 Anxiety disorder, unspecified: Secondary | ICD-10-CM | POA: Diagnosis not present

## 2020-08-05 DIAGNOSIS — R5383 Other fatigue: Secondary | ICD-10-CM | POA: Diagnosis not present

## 2020-08-05 DIAGNOSIS — E559 Vitamin D deficiency, unspecified: Secondary | ICD-10-CM | POA: Diagnosis not present

## 2020-08-05 DIAGNOSIS — Z79899 Other long term (current) drug therapy: Secondary | ICD-10-CM | POA: Diagnosis not present

## 2020-08-09 ENCOUNTER — Encounter (INDEPENDENT_AMBULATORY_CARE_PROVIDER_SITE_OTHER): Payer: Self-pay | Admitting: *Deleted

## 2020-08-22 DIAGNOSIS — Z124 Encounter for screening for malignant neoplasm of cervix: Secondary | ICD-10-CM | POA: Diagnosis not present

## 2020-08-22 DIAGNOSIS — R300 Dysuria: Secondary | ICD-10-CM | POA: Diagnosis not present

## 2020-08-22 DIAGNOSIS — Z01419 Encounter for gynecological examination (general) (routine) without abnormal findings: Secondary | ICD-10-CM | POA: Diagnosis not present

## 2020-08-30 DIAGNOSIS — L570 Actinic keratosis: Secondary | ICD-10-CM | POA: Diagnosis not present

## 2020-09-17 DIAGNOSIS — U071 COVID-19: Secondary | ICD-10-CM | POA: Diagnosis not present

## 2020-09-20 ENCOUNTER — Other Ambulatory Visit: Payer: Self-pay | Admitting: Obstetrics and Gynecology

## 2020-09-20 DIAGNOSIS — N644 Mastodynia: Secondary | ICD-10-CM

## 2020-09-21 ENCOUNTER — Other Ambulatory Visit: Payer: Self-pay | Admitting: Obstetrics and Gynecology

## 2020-09-21 DIAGNOSIS — N644 Mastodynia: Secondary | ICD-10-CM

## 2020-10-24 DIAGNOSIS — J069 Acute upper respiratory infection, unspecified: Secondary | ICD-10-CM | POA: Diagnosis not present

## 2020-10-24 DIAGNOSIS — Z299 Encounter for prophylactic measures, unspecified: Secondary | ICD-10-CM | POA: Diagnosis not present

## 2020-10-26 ENCOUNTER — Ambulatory Visit
Admission: RE | Admit: 2020-10-26 | Discharge: 2020-10-26 | Disposition: A | Discharge status: Home / Self Care | Payer: Medicare Other | Source: Ambulatory Visit | Attending: Obstetrics and Gynecology | Admitting: Obstetrics and Gynecology

## 2020-10-26 ENCOUNTER — Other Ambulatory Visit: Payer: Self-pay

## 2020-10-26 DIAGNOSIS — N644 Mastodynia: Secondary | ICD-10-CM

## 2020-10-26 DIAGNOSIS — R922 Inconclusive mammogram: Secondary | ICD-10-CM | POA: Diagnosis not present

## 2020-10-28 DIAGNOSIS — Z299 Encounter for prophylactic measures, unspecified: Secondary | ICD-10-CM | POA: Diagnosis not present

## 2020-10-28 DIAGNOSIS — J069 Acute upper respiratory infection, unspecified: Secondary | ICD-10-CM | POA: Diagnosis not present

## 2020-11-02 DIAGNOSIS — Z299 Encounter for prophylactic measures, unspecified: Secondary | ICD-10-CM | POA: Diagnosis not present

## 2020-11-02 DIAGNOSIS — F3342 Major depressive disorder, recurrent, in full remission: Secondary | ICD-10-CM | POA: Diagnosis not present

## 2020-11-02 DIAGNOSIS — J45909 Unspecified asthma, uncomplicated: Secondary | ICD-10-CM | POA: Diagnosis not present

## 2020-11-02 DIAGNOSIS — R059 Cough, unspecified: Secondary | ICD-10-CM | POA: Diagnosis not present

## 2020-11-17 DIAGNOSIS — M25511 Pain in right shoulder: Secondary | ICD-10-CM | POA: Diagnosis not present

## 2020-11-17 DIAGNOSIS — M25461 Effusion, right knee: Secondary | ICD-10-CM | POA: Diagnosis not present

## 2020-11-17 DIAGNOSIS — M19012 Primary osteoarthritis, left shoulder: Secondary | ICD-10-CM | POA: Diagnosis not present

## 2020-11-17 DIAGNOSIS — Z299 Encounter for prophylactic measures, unspecified: Secondary | ICD-10-CM | POA: Diagnosis not present

## 2020-11-17 DIAGNOSIS — M19011 Primary osteoarthritis, right shoulder: Secondary | ICD-10-CM | POA: Diagnosis not present

## 2020-11-17 DIAGNOSIS — E538 Deficiency of other specified B group vitamins: Secondary | ICD-10-CM | POA: Diagnosis not present

## 2020-11-17 DIAGNOSIS — M25561 Pain in right knee: Secondary | ICD-10-CM | POA: Diagnosis not present

## 2020-11-17 DIAGNOSIS — R252 Cramp and spasm: Secondary | ICD-10-CM | POA: Diagnosis not present

## 2020-11-17 DIAGNOSIS — M25512 Pain in left shoulder: Secondary | ICD-10-CM | POA: Diagnosis not present

## 2020-12-05 DIAGNOSIS — F419 Anxiety disorder, unspecified: Secondary | ICD-10-CM | POA: Diagnosis not present

## 2020-12-05 DIAGNOSIS — R52 Pain, unspecified: Secondary | ICD-10-CM | POA: Diagnosis not present

## 2020-12-05 DIAGNOSIS — M25512 Pain in left shoulder: Secondary | ICD-10-CM | POA: Diagnosis not present

## 2020-12-05 DIAGNOSIS — Z299 Encounter for prophylactic measures, unspecified: Secondary | ICD-10-CM | POA: Diagnosis not present

## 2020-12-05 DIAGNOSIS — K2970 Gastritis, unspecified, without bleeding: Secondary | ICD-10-CM | POA: Diagnosis not present

## 2020-12-09 ENCOUNTER — Other Ambulatory Visit: Payer: Self-pay | Admitting: Orthopedic Surgery

## 2020-12-09 ENCOUNTER — Other Ambulatory Visit (HOSPITAL_COMMUNITY): Payer: Self-pay | Admitting: Orthopedic Surgery

## 2020-12-09 DIAGNOSIS — M25512 Pain in left shoulder: Secondary | ICD-10-CM

## 2020-12-14 ENCOUNTER — Ambulatory Visit (HOSPITAL_COMMUNITY): Payer: Medicare Other

## 2020-12-14 ENCOUNTER — Ambulatory Visit (HOSPITAL_COMMUNITY)
Admission: RE | Admit: 2020-12-14 | Discharge: 2020-12-14 | Disposition: A | Discharge status: Home / Self Care | Payer: Medicare Other | Source: Ambulatory Visit | Attending: Orthopedic Surgery | Admitting: Orthopedic Surgery

## 2020-12-14 ENCOUNTER — Other Ambulatory Visit: Payer: Self-pay

## 2020-12-14 DIAGNOSIS — M25512 Pain in left shoulder: Secondary | ICD-10-CM | POA: Diagnosis not present

## 2020-12-14 DIAGNOSIS — M25412 Effusion, left shoulder: Secondary | ICD-10-CM | POA: Diagnosis not present

## 2020-12-14 DIAGNOSIS — S46012A Strain of muscle(s) and tendon(s) of the rotator cuff of left shoulder, initial encounter: Secondary | ICD-10-CM | POA: Diagnosis not present

## 2020-12-16 DIAGNOSIS — M25512 Pain in left shoulder: Secondary | ICD-10-CM | POA: Diagnosis not present

## 2021-01-04 DIAGNOSIS — R109 Unspecified abdominal pain: Secondary | ICD-10-CM | POA: Diagnosis not present

## 2021-01-04 DIAGNOSIS — R42 Dizziness and giddiness: Secondary | ICD-10-CM | POA: Diagnosis not present

## 2021-01-04 DIAGNOSIS — H10013 Acute follicular conjunctivitis, bilateral: Secondary | ICD-10-CM | POA: Diagnosis not present

## 2021-01-04 DIAGNOSIS — R21 Rash and other nonspecific skin eruption: Secondary | ICD-10-CM | POA: Diagnosis not present

## 2021-01-04 DIAGNOSIS — Z6821 Body mass index (BMI) 21.0-21.9, adult: Secondary | ICD-10-CM | POA: Diagnosis not present

## 2021-01-04 DIAGNOSIS — Z299 Encounter for prophylactic measures, unspecified: Secondary | ICD-10-CM | POA: Diagnosis not present

## 2021-01-04 DIAGNOSIS — H40033 Anatomical narrow angle, bilateral: Secondary | ICD-10-CM | POA: Diagnosis not present

## 2021-02-27 DIAGNOSIS — L570 Actinic keratosis: Secondary | ICD-10-CM | POA: Diagnosis not present

## 2021-07-07 DIAGNOSIS — Z299 Encounter for prophylactic measures, unspecified: Secondary | ICD-10-CM | POA: Diagnosis not present

## 2021-07-07 DIAGNOSIS — M549 Dorsalgia, unspecified: Secondary | ICD-10-CM | POA: Diagnosis not present

## 2021-07-07 DIAGNOSIS — L309 Dermatitis, unspecified: Secondary | ICD-10-CM | POA: Diagnosis not present

## 2021-07-07 DIAGNOSIS — F3342 Major depressive disorder, recurrent, in full remission: Secondary | ICD-10-CM | POA: Diagnosis not present

## 2021-07-07 DIAGNOSIS — M25511 Pain in right shoulder: Secondary | ICD-10-CM | POA: Diagnosis not present

## 2021-08-02 DIAGNOSIS — M25511 Pain in right shoulder: Secondary | ICD-10-CM | POA: Diagnosis not present

## 2021-08-02 DIAGNOSIS — M6281 Muscle weakness (generalized): Secondary | ICD-10-CM | POA: Diagnosis not present

## 2021-08-02 DIAGNOSIS — M25512 Pain in left shoulder: Secondary | ICD-10-CM | POA: Diagnosis not present

## 2021-08-04 DIAGNOSIS — M25512 Pain in left shoulder: Secondary | ICD-10-CM | POA: Diagnosis not present

## 2021-08-04 DIAGNOSIS — M6281 Muscle weakness (generalized): Secondary | ICD-10-CM | POA: Diagnosis not present

## 2021-08-04 DIAGNOSIS — M25511 Pain in right shoulder: Secondary | ICD-10-CM | POA: Diagnosis not present

## 2021-08-07 DIAGNOSIS — M25511 Pain in right shoulder: Secondary | ICD-10-CM | POA: Diagnosis not present

## 2021-08-07 DIAGNOSIS — M6281 Muscle weakness (generalized): Secondary | ICD-10-CM | POA: Diagnosis not present

## 2021-08-07 DIAGNOSIS — M25512 Pain in left shoulder: Secondary | ICD-10-CM | POA: Diagnosis not present

## 2021-08-09 DIAGNOSIS — M25511 Pain in right shoulder: Secondary | ICD-10-CM | POA: Diagnosis not present

## 2021-08-09 DIAGNOSIS — M25512 Pain in left shoulder: Secondary | ICD-10-CM | POA: Diagnosis not present

## 2021-08-09 DIAGNOSIS — M6281 Muscle weakness (generalized): Secondary | ICD-10-CM | POA: Diagnosis not present

## 2021-08-21 DIAGNOSIS — M25512 Pain in left shoulder: Secondary | ICD-10-CM | POA: Diagnosis not present

## 2021-08-21 DIAGNOSIS — M25511 Pain in right shoulder: Secondary | ICD-10-CM | POA: Diagnosis not present

## 2021-08-28 DIAGNOSIS — M25511 Pain in right shoulder: Secondary | ICD-10-CM | POA: Diagnosis not present

## 2021-08-28 DIAGNOSIS — M25512 Pain in left shoulder: Secondary | ICD-10-CM | POA: Diagnosis not present

## 2021-08-29 DIAGNOSIS — L570 Actinic keratosis: Secondary | ICD-10-CM | POA: Diagnosis not present

## 2021-09-04 DIAGNOSIS — M25512 Pain in left shoulder: Secondary | ICD-10-CM | POA: Diagnosis not present

## 2021-09-04 DIAGNOSIS — M25511 Pain in right shoulder: Secondary | ICD-10-CM | POA: Diagnosis not present

## 2021-09-06 DIAGNOSIS — M25512 Pain in left shoulder: Secondary | ICD-10-CM | POA: Diagnosis not present

## 2021-09-06 DIAGNOSIS — M25511 Pain in right shoulder: Secondary | ICD-10-CM | POA: Diagnosis not present

## 2021-09-07 DIAGNOSIS — D239 Other benign neoplasm of skin, unspecified: Secondary | ICD-10-CM | POA: Diagnosis not present

## 2021-09-11 DIAGNOSIS — M25512 Pain in left shoulder: Secondary | ICD-10-CM | POA: Diagnosis not present

## 2021-09-11 DIAGNOSIS — M25511 Pain in right shoulder: Secondary | ICD-10-CM | POA: Diagnosis not present

## 2021-09-13 DIAGNOSIS — M25512 Pain in left shoulder: Secondary | ICD-10-CM | POA: Diagnosis not present

## 2021-09-13 DIAGNOSIS — M25511 Pain in right shoulder: Secondary | ICD-10-CM | POA: Diagnosis not present

## 2021-09-22 DIAGNOSIS — M25512 Pain in left shoulder: Secondary | ICD-10-CM | POA: Diagnosis not present

## 2021-09-22 DIAGNOSIS — M25511 Pain in right shoulder: Secondary | ICD-10-CM | POA: Diagnosis not present

## 2021-09-25 DIAGNOSIS — M25511 Pain in right shoulder: Secondary | ICD-10-CM | POA: Diagnosis not present

## 2021-09-25 DIAGNOSIS — M25512 Pain in left shoulder: Secondary | ICD-10-CM | POA: Diagnosis not present

## 2021-09-27 DIAGNOSIS — M25512 Pain in left shoulder: Secondary | ICD-10-CM | POA: Diagnosis not present

## 2021-09-27 DIAGNOSIS — M25511 Pain in right shoulder: Secondary | ICD-10-CM | POA: Diagnosis not present

## 2021-10-02 DIAGNOSIS — M25511 Pain in right shoulder: Secondary | ICD-10-CM | POA: Diagnosis not present

## 2021-10-02 DIAGNOSIS — M25512 Pain in left shoulder: Secondary | ICD-10-CM | POA: Diagnosis not present

## 2021-10-09 DIAGNOSIS — M25512 Pain in left shoulder: Secondary | ICD-10-CM | POA: Diagnosis not present

## 2021-10-09 DIAGNOSIS — M25511 Pain in right shoulder: Secondary | ICD-10-CM | POA: Diagnosis not present

## 2021-10-11 DIAGNOSIS — M25512 Pain in left shoulder: Secondary | ICD-10-CM | POA: Diagnosis not present

## 2021-10-11 DIAGNOSIS — M25511 Pain in right shoulder: Secondary | ICD-10-CM | POA: Diagnosis not present

## 2021-10-18 DIAGNOSIS — M25512 Pain in left shoulder: Secondary | ICD-10-CM | POA: Diagnosis not present

## 2021-10-18 DIAGNOSIS — M25511 Pain in right shoulder: Secondary | ICD-10-CM | POA: Diagnosis not present

## 2021-10-23 DIAGNOSIS — M25511 Pain in right shoulder: Secondary | ICD-10-CM | POA: Diagnosis not present

## 2021-10-23 DIAGNOSIS — M25512 Pain in left shoulder: Secondary | ICD-10-CM | POA: Diagnosis not present

## 2021-12-15 DIAGNOSIS — M5136 Other intervertebral disc degeneration, lumbar region: Secondary | ICD-10-CM | POA: Diagnosis not present

## 2021-12-15 DIAGNOSIS — M47897 Other spondylosis, lumbosacral region: Secondary | ICD-10-CM | POA: Diagnosis not present

## 2021-12-15 DIAGNOSIS — R5383 Other fatigue: Secondary | ICD-10-CM | POA: Diagnosis not present

## 2021-12-15 DIAGNOSIS — M545 Low back pain, unspecified: Secondary | ICD-10-CM | POA: Diagnosis not present

## 2021-12-15 DIAGNOSIS — Z299 Encounter for prophylactic measures, unspecified: Secondary | ICD-10-CM | POA: Diagnosis not present

## 2022-03-05 DIAGNOSIS — L570 Actinic keratosis: Secondary | ICD-10-CM | POA: Diagnosis not present

## 2022-09-04 DIAGNOSIS — L570 Actinic keratosis: Secondary | ICD-10-CM | POA: Diagnosis not present

## 2022-09-07 DIAGNOSIS — H04123 Dry eye syndrome of bilateral lacrimal glands: Secondary | ICD-10-CM | POA: Diagnosis not present

## 2022-10-12 DIAGNOSIS — J302 Other seasonal allergic rhinitis: Secondary | ICD-10-CM | POA: Diagnosis not present

## 2022-10-12 DIAGNOSIS — L239 Allergic contact dermatitis, unspecified cause: Secondary | ICD-10-CM | POA: Diagnosis not present

## 2022-10-12 DIAGNOSIS — Z299 Encounter for prophylactic measures, unspecified: Secondary | ICD-10-CM | POA: Diagnosis not present

## 2022-10-12 DIAGNOSIS — H1010 Acute atopic conjunctivitis, unspecified eye: Secondary | ICD-10-CM | POA: Diagnosis not present

## 2022-11-12 DIAGNOSIS — Z2821 Immunization not carried out because of patient refusal: Secondary | ICD-10-CM | POA: Diagnosis not present

## 2022-11-12 DIAGNOSIS — R42 Dizziness and giddiness: Secondary | ICD-10-CM | POA: Diagnosis not present

## 2022-11-12 DIAGNOSIS — K219 Gastro-esophageal reflux disease without esophagitis: Secondary | ICD-10-CM | POA: Diagnosis not present

## 2022-11-12 DIAGNOSIS — Z299 Encounter for prophylactic measures, unspecified: Secondary | ICD-10-CM | POA: Diagnosis not present

## 2022-12-17 DIAGNOSIS — L309 Dermatitis, unspecified: Secondary | ICD-10-CM | POA: Diagnosis not present

## 2022-12-25 DIAGNOSIS — Z1212 Encounter for screening for malignant neoplasm of rectum: Secondary | ICD-10-CM | POA: Diagnosis not present

## 2022-12-25 DIAGNOSIS — Z1211 Encounter for screening for malignant neoplasm of colon: Secondary | ICD-10-CM | POA: Diagnosis not present

## 2023-03-04 DIAGNOSIS — L570 Actinic keratosis: Secondary | ICD-10-CM | POA: Diagnosis not present

## 2023-03-04 DIAGNOSIS — L709 Acne, unspecified: Secondary | ICD-10-CM | POA: Diagnosis not present

## 2023-03-04 DIAGNOSIS — L309 Dermatitis, unspecified: Secondary | ICD-10-CM | POA: Diagnosis not present

## 2023-06-04 DIAGNOSIS — F3342 Major depressive disorder, recurrent, in full remission: Secondary | ICD-10-CM | POA: Diagnosis not present

## 2023-06-04 DIAGNOSIS — H6641 Suppurative otitis media, unspecified, right ear: Secondary | ICD-10-CM | POA: Diagnosis not present

## 2023-06-04 DIAGNOSIS — B370 Candidal stomatitis: Secondary | ICD-10-CM | POA: Diagnosis not present

## 2023-06-04 DIAGNOSIS — R6889 Other general symptoms and signs: Secondary | ICD-10-CM | POA: Diagnosis not present

## 2023-06-04 DIAGNOSIS — Z299 Encounter for prophylactic measures, unspecified: Secondary | ICD-10-CM | POA: Diagnosis not present

## 2023-06-10 DIAGNOSIS — K219 Gastro-esophageal reflux disease without esophagitis: Secondary | ICD-10-CM | POA: Diagnosis not present

## 2023-06-10 DIAGNOSIS — J209 Acute bronchitis, unspecified: Secondary | ICD-10-CM | POA: Diagnosis not present

## 2023-06-10 DIAGNOSIS — Z299 Encounter for prophylactic measures, unspecified: Secondary | ICD-10-CM | POA: Diagnosis not present

## 2023-06-18 DIAGNOSIS — Z299 Encounter for prophylactic measures, unspecified: Secondary | ICD-10-CM | POA: Diagnosis not present

## 2023-06-18 DIAGNOSIS — R059 Cough, unspecified: Secondary | ICD-10-CM | POA: Diagnosis not present

## 2023-06-18 DIAGNOSIS — Z79810 Long term (current) use of selective estrogen receptor modulators (SERMs): Secondary | ICD-10-CM | POA: Diagnosis not present

## 2023-06-18 DIAGNOSIS — R5383 Other fatigue: Secondary | ICD-10-CM | POA: Diagnosis not present

## 2023-06-18 DIAGNOSIS — J42 Unspecified chronic bronchitis: Secondary | ICD-10-CM | POA: Diagnosis not present

## 2023-06-18 DIAGNOSIS — R5381 Other malaise: Secondary | ICD-10-CM | POA: Diagnosis not present

## 2023-06-25 IMAGING — MR MR SHOULDER*L* W/O CM
5 series · 40 of 40 positions shown · non-contrast
Comparison: X-ray shoulder 11/17/2020.

CLINICAL DATA: Left shoulder pain for 1 [DATE] months.

EXAM:
MRI OF THE LEFT SHOULDER WITHOUT CONTRAST
TECHNIQUE: Multiplanar, multisequence MR imaging of the shoulder was performed.
No intravenous contrast was administered.

[Series 5: T2 fat-sat · axial · left · 4.0mm · 0.47mm/px · z∈[-45,+67]mm · 8 of 26 slices shown (1 of 3)]
[im 1/26]
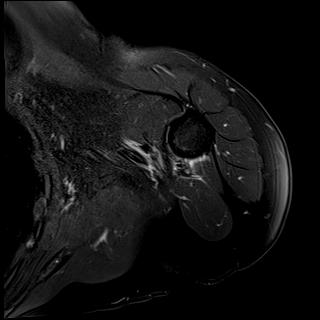
[im 4/26]
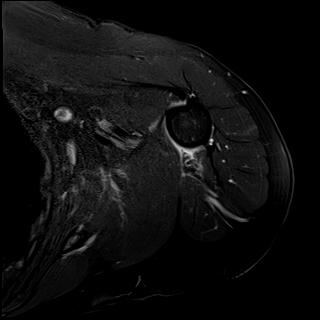
[im 8/26]
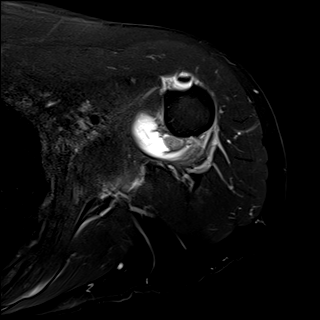
[im 11/26]
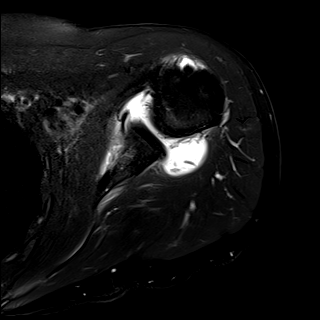
[im 15/26]
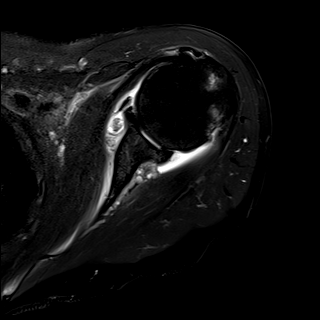
[im 18/26]
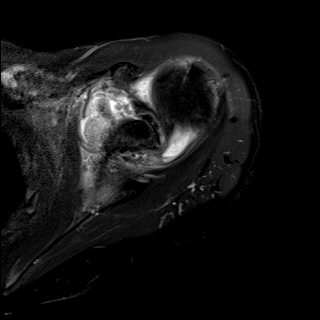
[im 22/26]
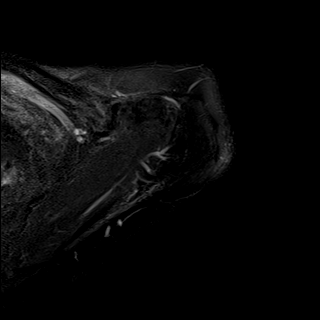
[im 26/26]
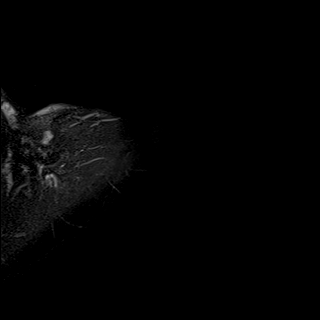

[Series 7: T2 fat-sat · oblique · left · 4.0mm · 0.44mm/px · 8 of 23 slices shown (2 of 3)]
[im 1/23]
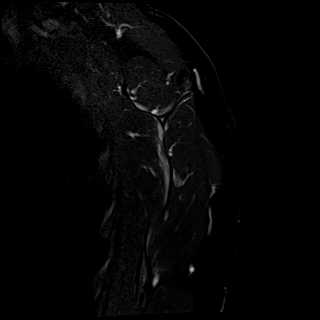
[im 4/23]
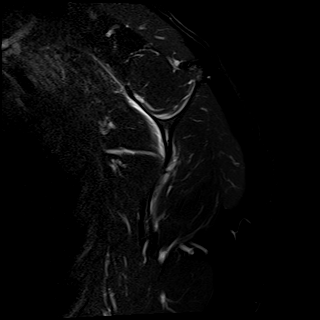
[im 7/23]
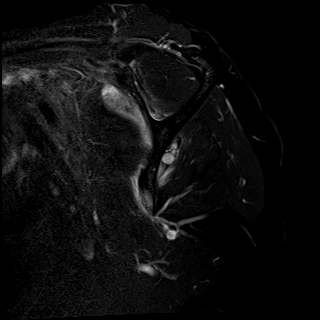
[im 10/23]
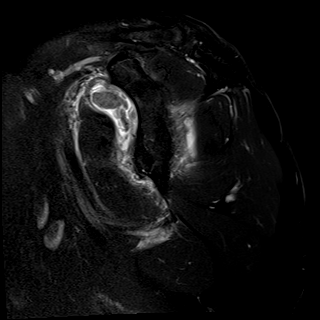
[im 13/23]
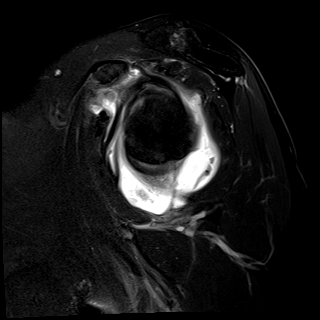
[im 16/23]
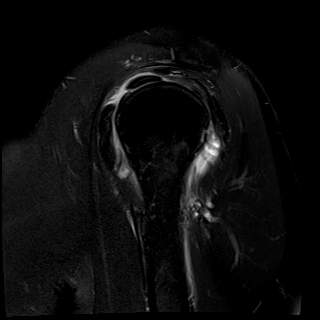
[im 19/23]
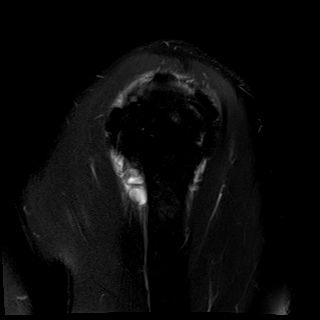
[im 23/23]
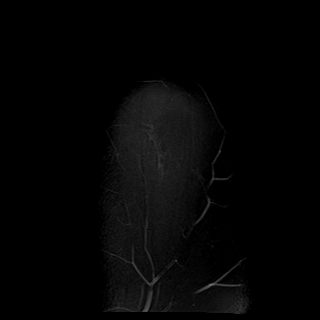

[Series 8: T1 · oblique · left · 4.0mm · 0.41mm/px · 8 of 24 slices shown]
[im 1/24]
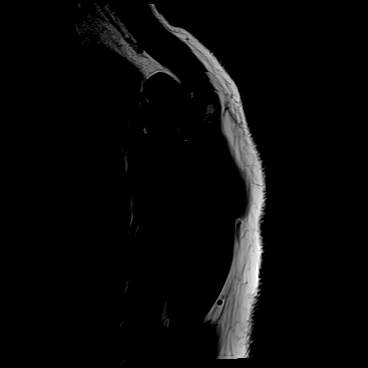
[im 4/24]
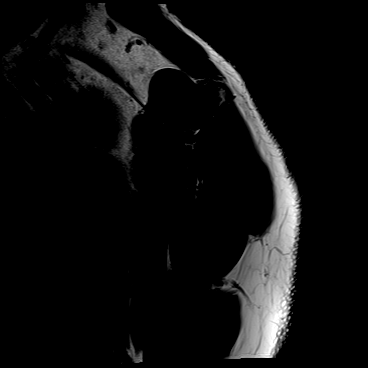
[im 7/24]
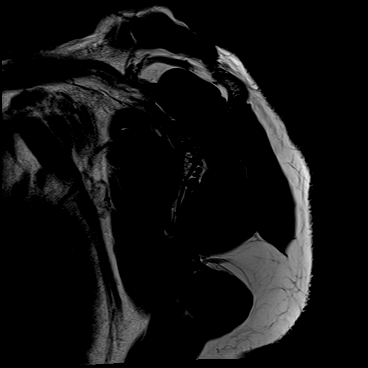
[im 10/24]
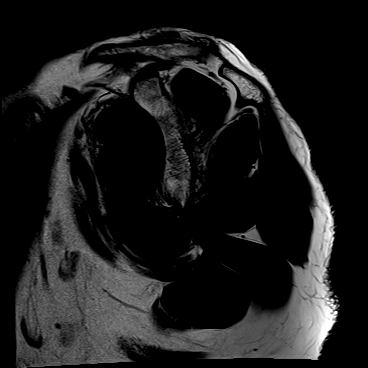
[im 14/24]
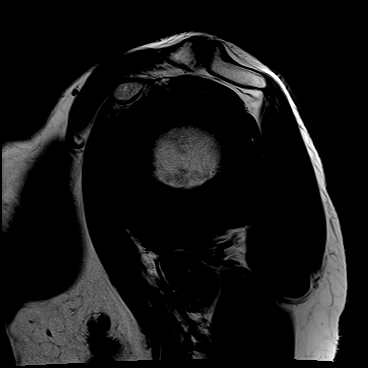
[im 17/24]
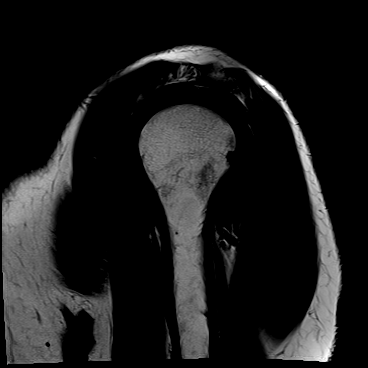
[im 20/24]
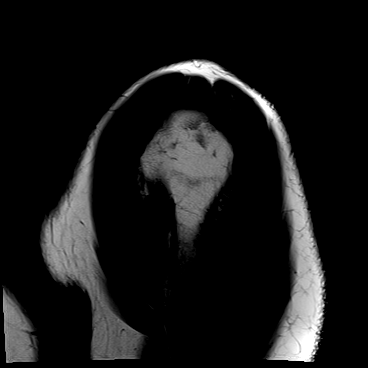
[im 24/24]
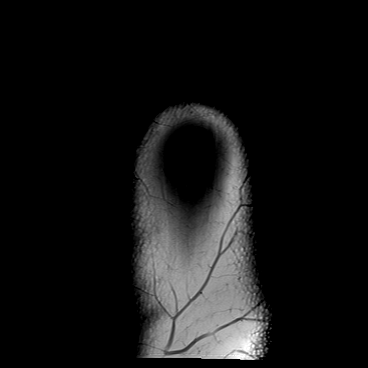

[Series 9: PD · oblique · left · 4.0mm · 0.47mm/px · 8 of 24 slices shown]
[im 1/24]
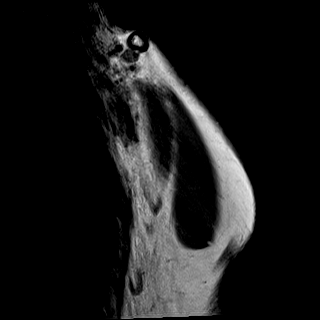
[im 4/24]
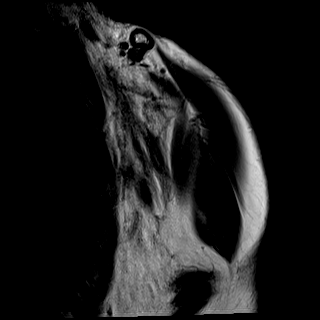
[im 7/24]
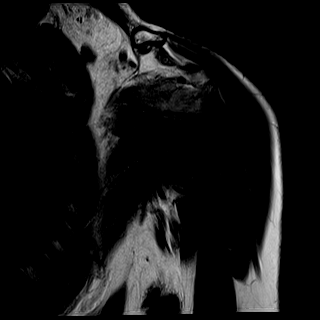
[im 10/24]
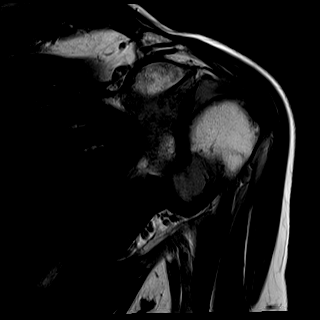
[im 14/24]
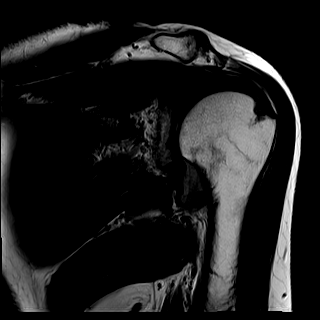
[im 17/24]
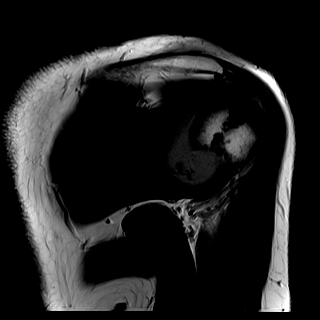
[im 20/24]
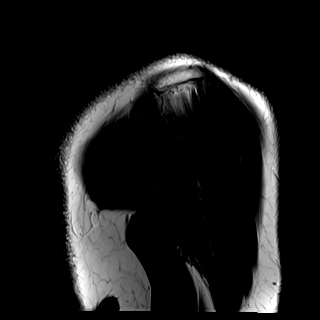
[im 24/24]
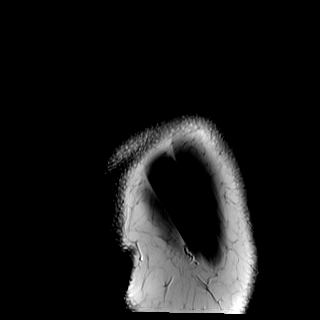

[Series 10: T2 fat-sat · oblique · left · 4.0mm · 0.47mm/px · 8 of 24 slices shown (3 of 3)]
[im 1/24]
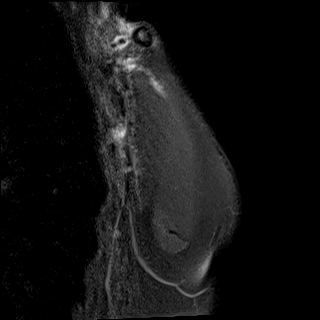
[im 4/24]
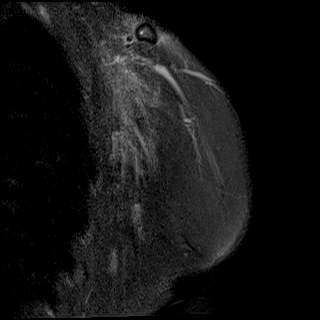
[im 7/24]
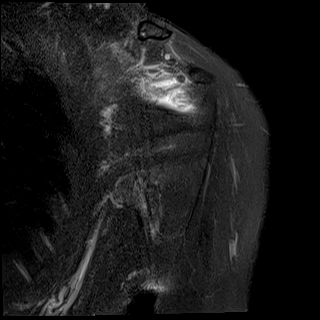
[im 10/24]
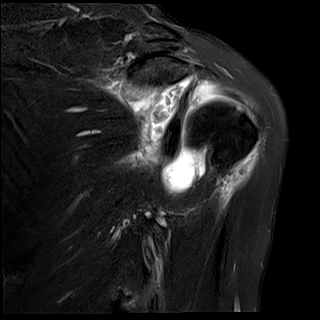
[im 14/24]
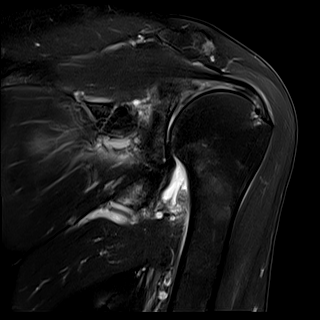
[im 17/24]
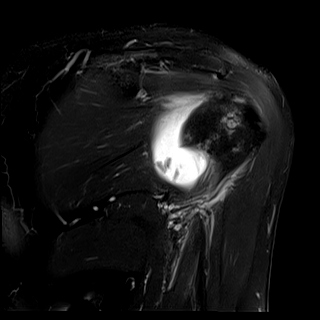
[im 20/24]
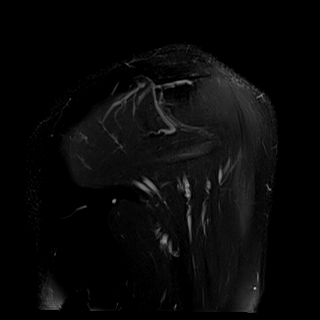
[im 24/24]
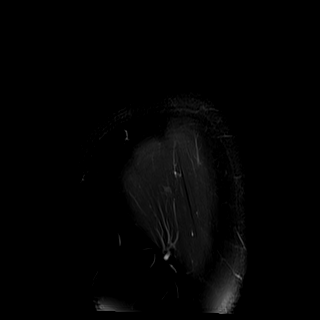

[40 of 40 positions shown; findings below may reference images not displayed]

FINDINGS: Rotator cuff: Moderate tendinosis of the supraspinatus tendon with a
small interstitial insertional tear. Mild tendinosis of the
infraspinatus tendon with a small partial-thickness articular
surface tear anteriorly. Teres minor tendon is intact. Subscapularis
tendon is intact.

Muscles: No muscle atrophy or edema. No intramuscular fluid
collection or hematoma.

Biceps Long Head: Mild tendinosis of the intra-articular portion of
the long head of the biceps tendon.

Acromioclavicular Joint: Moderate arthropathy of the
acromioclavicular joint. Trace subacromial/subdeltoid bursal fluid.

Glenohumeral Joint: Large joint effusion. High-grade
partial-thickness cartilage loss with areas of full-thickness
cartilage loss of the glenohumeral joint.

Labrum: Grossly intact, but evaluation is limited by lack of
intraarticular fluid/contrast.

Bones: No fracture or dislocation. No aggressive osseous lesion.

Other: No fluid collection or hematoma.
IMPRESSION: 1. Moderate tendinosis of the supraspinatus tendon with a small
interstitial insertional tear.
2. Mild tendinosis of the infraspinatus tendon with a small
partial-thickness articular surface tear anteriorly.
3. Severe osteoarthritis of the glenohumeral joint with a large
joint
4. Mild tendinosis of the intra-articular portion of the long head
of the biceps tendon.

## 2023-07-16 DIAGNOSIS — Z9181 History of falling: Secondary | ICD-10-CM | POA: Diagnosis not present

## 2023-07-16 DIAGNOSIS — M25559 Pain in unspecified hip: Secondary | ICD-10-CM | POA: Diagnosis not present

## 2023-07-16 DIAGNOSIS — Z299 Encounter for prophylactic measures, unspecified: Secondary | ICD-10-CM | POA: Diagnosis not present

## 2023-07-16 DIAGNOSIS — M4726 Other spondylosis with radiculopathy, lumbar region: Secondary | ICD-10-CM | POA: Diagnosis not present

## 2023-07-16 DIAGNOSIS — M5116 Intervertebral disc disorders with radiculopathy, lumbar region: Secondary | ICD-10-CM | POA: Diagnosis not present

## 2023-07-16 DIAGNOSIS — M545 Low back pain, unspecified: Secondary | ICD-10-CM | POA: Diagnosis not present

## 2023-07-16 DIAGNOSIS — M419 Scoliosis, unspecified: Secondary | ICD-10-CM | POA: Diagnosis not present

## 2023-07-16 DIAGNOSIS — Z6822 Body mass index (BMI) 22.0-22.9, adult: Secondary | ICD-10-CM | POA: Diagnosis not present

## 2023-07-16 DIAGNOSIS — Z8739 Personal history of other diseases of the musculoskeletal system and connective tissue: Secondary | ICD-10-CM | POA: Diagnosis not present

## 2023-08-05 DIAGNOSIS — M818 Other osteoporosis without current pathological fracture: Secondary | ICD-10-CM | POA: Diagnosis not present

## 2023-09-08 DIAGNOSIS — M25571 Pain in right ankle and joints of right foot: Secondary | ICD-10-CM | POA: Diagnosis not present

## 2023-09-08 DIAGNOSIS — M25471 Effusion, right ankle: Secondary | ICD-10-CM | POA: Diagnosis not present

## 2023-09-09 DIAGNOSIS — R52 Pain, unspecified: Secondary | ICD-10-CM | POA: Diagnosis not present

## 2023-09-09 DIAGNOSIS — M25571 Pain in right ankle and joints of right foot: Secondary | ICD-10-CM | POA: Diagnosis not present

## 2023-09-09 DIAGNOSIS — R609 Edema, unspecified: Secondary | ICD-10-CM | POA: Diagnosis not present

## 2023-09-09 DIAGNOSIS — Z299 Encounter for prophylactic measures, unspecified: Secondary | ICD-10-CM | POA: Diagnosis not present

## 2023-09-27 DIAGNOSIS — E7800 Pure hypercholesterolemia, unspecified: Secondary | ICD-10-CM | POA: Diagnosis not present

## 2023-09-27 DIAGNOSIS — F419 Anxiety disorder, unspecified: Secondary | ICD-10-CM | POA: Diagnosis not present

## 2023-09-27 DIAGNOSIS — J42 Unspecified chronic bronchitis: Secondary | ICD-10-CM | POA: Diagnosis not present

## 2023-09-27 DIAGNOSIS — F322 Major depressive disorder, single episode, severe without psychotic features: Secondary | ICD-10-CM | POA: Diagnosis not present

## 2023-09-27 DIAGNOSIS — Z0000 Encounter for general adult medical examination without abnormal findings: Secondary | ICD-10-CM | POA: Diagnosis not present

## 2023-09-27 DIAGNOSIS — R5383 Other fatigue: Secondary | ICD-10-CM | POA: Diagnosis not present

## 2023-09-27 DIAGNOSIS — Z7189 Other specified counseling: Secondary | ICD-10-CM | POA: Diagnosis not present

## 2023-09-27 DIAGNOSIS — Z299 Encounter for prophylactic measures, unspecified: Secondary | ICD-10-CM | POA: Diagnosis not present

## 2023-09-27 DIAGNOSIS — Z1331 Encounter for screening for depression: Secondary | ICD-10-CM | POA: Diagnosis not present

## 2023-09-27 DIAGNOSIS — Z79899 Other long term (current) drug therapy: Secondary | ICD-10-CM | POA: Diagnosis not present

## 2023-09-27 DIAGNOSIS — Z1339 Encounter for screening examination for other mental health and behavioral disorders: Secondary | ICD-10-CM | POA: Diagnosis not present

## 2023-10-03 DIAGNOSIS — Z299 Encounter for prophylactic measures, unspecified: Secondary | ICD-10-CM | POA: Diagnosis not present

## 2023-10-03 DIAGNOSIS — M25571 Pain in right ankle and joints of right foot: Secondary | ICD-10-CM | POA: Diagnosis not present

## 2023-10-03 DIAGNOSIS — E7800 Pure hypercholesterolemia, unspecified: Secondary | ICD-10-CM | POA: Diagnosis not present

## 2023-10-03 DIAGNOSIS — M6780 Other specified disorders of synovium and tendon, unspecified site: Secondary | ICD-10-CM | POA: Diagnosis not present

## 2023-10-07 DIAGNOSIS — M25571 Pain in right ankle and joints of right foot: Secondary | ICD-10-CM | POA: Diagnosis not present

## 2023-10-22 DIAGNOSIS — M810 Age-related osteoporosis without current pathological fracture: Secondary | ICD-10-CM | POA: Diagnosis not present

## 2023-10-29 DIAGNOSIS — R202 Paresthesia of skin: Secondary | ICD-10-CM | POA: Diagnosis not present

## 2023-10-29 DIAGNOSIS — L709 Acne, unspecified: Secondary | ICD-10-CM | POA: Diagnosis not present

## 2023-10-29 DIAGNOSIS — L814 Other melanin hyperpigmentation: Secondary | ICD-10-CM | POA: Diagnosis not present

## 2023-10-29 DIAGNOSIS — L570 Actinic keratosis: Secondary | ICD-10-CM | POA: Diagnosis not present

## 2024-01-18 DIAGNOSIS — H938X3 Other specified disorders of ear, bilateral: Secondary | ICD-10-CM | POA: Diagnosis not present

## 2024-01-18 DIAGNOSIS — J029 Acute pharyngitis, unspecified: Secondary | ICD-10-CM | POA: Diagnosis not present

## 2024-01-18 DIAGNOSIS — B9689 Other specified bacterial agents as the cause of diseases classified elsewhere: Secondary | ICD-10-CM | POA: Diagnosis not present

## 2024-01-18 DIAGNOSIS — R051 Acute cough: Secondary | ICD-10-CM | POA: Diagnosis not present

## 2024-01-18 DIAGNOSIS — H6993 Unspecified Eustachian tube disorder, bilateral: Secondary | ICD-10-CM | POA: Diagnosis not present

## 2024-01-18 DIAGNOSIS — J040 Acute laryngitis: Secondary | ICD-10-CM | POA: Diagnosis not present

## 2024-01-18 DIAGNOSIS — Z20822 Contact with and (suspected) exposure to covid-19: Secondary | ICD-10-CM | POA: Diagnosis not present

## 2024-01-18 DIAGNOSIS — J988 Other specified respiratory disorders: Secondary | ICD-10-CM | POA: Diagnosis not present

## 2024-01-18 DIAGNOSIS — J0110 Acute frontal sinusitis, unspecified: Secondary | ICD-10-CM | POA: Diagnosis not present
